# Patient Record
Sex: Male | Born: 2000 | Race: Black or African American | Hispanic: No | Marital: Single
Health system: Southern US, Community
[De-identification: ages and names within clinical notes are randomized; demographics above are authoritative.]

## PROBLEM LIST (undated history)

## (undated) DIAGNOSIS — F913 Oppositional defiant disorder: Secondary | ICD-10-CM

## (undated) DIAGNOSIS — F909 Attention-deficit hyperactivity disorder, unspecified type: Secondary | ICD-10-CM

## (undated) HISTORY — PX: CIRCUMCISION: SUR203

---
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
## 2021-04-02 ENCOUNTER — Ambulatory Visit (HOSPITAL_COMMUNITY)
Admission: EM | Admit: 2021-04-02 | Discharge: 2021-04-02 | Disposition: A | Discharge status: Home / Self Care | Payer: Self-pay | Attending: Urgent Care | Admitting: Urgent Care

## 2021-04-02 ENCOUNTER — Encounter (HOSPITAL_COMMUNITY): Payer: Self-pay

## 2021-04-02 DIAGNOSIS — R369 Urethral discharge, unspecified: Secondary | ICD-10-CM | POA: Insufficient documentation

## 2021-04-02 DIAGNOSIS — Z7251 High risk heterosexual behavior: Secondary | ICD-10-CM | POA: Insufficient documentation

## 2021-04-02 LAB — HIV ANTIBODY (ROUTINE TESTING W REFLEX): HIV Screen 4th Generation wRfx: NONREACTIVE

## 2021-04-02 NOTE — Discharge Instructions (Addendum)
Make sure you hydrate very well with plain water and a quantity of 64 ounces of water a day.  Please limit drinks that are considered urinary irritants such as soda, sweet tea, coffee, energy drinks, alcohol.  These can worsen your urinary and genital symptoms but also be the source of them.  I will let you know about your urine culture and penile swab, blood work results through MyChart to see if we need to prescribe or change your antibiotics based off of those results.

## 2021-04-02 NOTE — ED Provider Notes (Signed)
[facility]   MRN: [id number] DOB: 2001/04/05  Subjective:   Tyler Mcintosh is a 20 y.o. male presenting for STI check as he had slight discharge 2 days ago. Has also had internal itching of the penis, tingling. Does not use condoms for protection, has sex with females, more than 1 partner.   No current facility-administered medications for this encounter.  Current Outpatient Medications:    atomoxetine (STRATTERA) 40 MG capsule, Take 40 mg by mouth daily., Disp: , Rfl:    gentamicin (GARAMYCIN) 0.3 % ophthalmic solution, Place 1 drop into the left eye every 4 (four) hours., Disp: 5 mL, Rfl: 0   hydrOXYzine (VISTARIL) 25 MG capsule, Take 25 mg by mouth at bedtime as needed for anxiety (sleep). , Disp: , Rfl:    ondansetron (ZOFRAN) 8 MG tablet, Take 0.5 tablets (4 mg total) by mouth every 8 (eight) hours as needed for nausea or vomiting. (Patient not taking: Reported on 07/11/2014), Disp: 10 tablet, Rfl: 0   Allergies  Allergen Reactions   Amoxicillin    Other     vasoline     Past Medical History:  Diagnosis Date   ADHD (attention deficit hyperactivity disorder)    Oppositional defiant disorder      Past Surgical History:  Procedure Laterality Date   CIRCUMCISION      History reviewed. No pertinent family history.  Social History   Tobacco Use   Smoking status: Passive Smoke Exposure - Never Smoker   Smokeless tobacco: Never  Substance Use Topics   Alcohol use: No    ROS   Objective:   Vitals: There were no vitals taken for this visit.  Physical Exam Constitutional:      General: He is not in acute distress.    Appearance: Normal appearance. He is well-developed and normal weight. He is not ill-appearing, toxic-appearing or diaphoretic.  HENT:     Head: Normocephalic and atraumatic.     Right Ear: External ear normal.     Left Ear: External ear normal.     Nose: Nose normal.     Mouth/Throat:     Pharynx: Oropharynx is clear.   Eyes:     General: No scleral icterus.       Right eye: No discharge.        Left eye: No discharge.     Extraocular Movements: Extraocular movements intact.     Pupils: Pupils are equal, round, and reactive to light.  Cardiovascular:     Rate and Rhythm: Normal rate.  Pulmonary:     Effort: Pulmonary effort is normal.  Genitourinary:    Penis: Circumcised. No phimosis, paraphimosis, hypospadias, erythema, tenderness, discharge, swelling or lesions.   Musculoskeletal:     Cervical back: Normal range of motion.  Neurological:     Mental Status: He is alert and oriented to person, place, and time.  Psychiatric:        Mood and Affect: Mood normal.        Behavior: Behavior normal.        Thought Content: Thought content normal.        Judgment: Judgment normal.     Assessment and Plan :   PDMP not reviewed this encounter.  1. Penile discharge   2. Unprotected sex    Exam is benign, will treat based off of results.  Encourage patient to hydrate more consistently and avoid drinking a lot of fruit juice as this could also cause urinary irritation.  Counseled patient on potential for adverse effects with medications prescribed/recommended today, ER and return-to-clinic precautions discussed, patient verbalized understanding.    Wallis Bamberg, PA-C 04/02/21 1301

## 2021-04-02 NOTE — ED Triage Notes (Signed)
Pt presents to office today for STI testing and blood work.

## 2021-04-03 LAB — CYTOLOGY, (ORAL, ANAL, URETHRAL) ANCILLARY ONLY
Chlamydia: POSITIVE — AB
Comment: NEGATIVE
Comment: NEGATIVE
Comment: NORMAL
Neisseria Gonorrhea: NEGATIVE
Trichomonas: NEGATIVE

## 2021-04-03 LAB — RPR: RPR Ser Ql: NONREACTIVE

## 2021-04-04 ENCOUNTER — Telehealth (HOSPITAL_COMMUNITY): Payer: Self-pay | Admitting: Emergency Medicine

## 2021-04-04 MED ORDER — DOXYCYCLINE HYCLATE 100 MG PO CAPS: 100.0000 mg | ORAL_CAPSULE | Freq: Two times a day (BID) | ORAL | 0 refills | Status: AC

## 2021-04-04 MED ORDER — DOXYCYCLINE HYCLATE 100 MG PO CAPS: 100.0000 mg | ORAL_CAPSULE | Freq: Two times a day (BID) | ORAL | 0 refills | Status: DC

## 2021-04-04 NOTE — Telephone Encounter (Signed)
Patient requested prescription be sent to different pharmacy.
